# Patient Record
Sex: Male | Born: 1970 | Race: White | Hispanic: No | Marital: Married | Smoking: Former smoker
Health system: Southern US, Community
[De-identification: ages and names within clinical notes are randomized; demographics above are authoritative.]

## PROBLEM LIST (undated history)

## (undated) DIAGNOSIS — I10 Essential (primary) hypertension: Secondary | ICD-10-CM

## (undated) DIAGNOSIS — E669 Obesity, unspecified: Secondary | ICD-10-CM

## (undated) DIAGNOSIS — E559 Vitamin D deficiency, unspecified: Secondary | ICD-10-CM

## (undated) DIAGNOSIS — M109 Gout, unspecified: Secondary | ICD-10-CM

## (undated) HISTORY — DX: Vitamin D deficiency, unspecified: E55.9

## (undated) HISTORY — DX: Obesity, unspecified: E66.9

## (undated) HISTORY — DX: Gout, unspecified: M10.9

## (undated) HISTORY — DX: Essential (primary) hypertension: I10

---

## 2006-08-06 DIAGNOSIS — M109 Gout, unspecified: Secondary | ICD-10-CM

## 2006-08-06 HISTORY — DX: Gout, unspecified: M10.9

## 2009-08-06 DIAGNOSIS — E559 Vitamin D deficiency, unspecified: Secondary | ICD-10-CM

## 2009-08-06 HISTORY — DX: Vitamin D deficiency, unspecified: E55.9

## 2011-11-01 LAB — COMPREHENSIVE METABOLIC PANEL
ALT: 74 U/L — AB (ref 10–40)
AST: 38 U/L

## 2012-08-12 ENCOUNTER — Ambulatory Visit (INDEPENDENT_AMBULATORY_CARE_PROVIDER_SITE_OTHER): Payer: Managed Care, Other (non HMO) | Admitting: Physician Assistant

## 2012-08-12 VITALS — BP 144/92 | HR 116 | Temp 99.0°F | Resp 17 | Ht 70.0 in | Wt 232.0 lb

## 2012-08-12 DIAGNOSIS — J329 Chronic sinusitis, unspecified: Secondary | ICD-10-CM

## 2012-08-12 DIAGNOSIS — R05 Cough: Secondary | ICD-10-CM

## 2012-08-12 MED ORDER — AMOXICILLIN 875 MG PO TABS: 1750.0000 mg | ORAL_TABLET | Freq: Two times a day (BID) | ORAL | Status: DC

## 2012-08-12 MED ORDER — HYDROCOD POLST-CHLORPHEN POLST 10-8 MG/5ML PO LQCR: 5.0000 mL | Freq: Every evening | ORAL | Status: DC | PRN

## 2012-08-12 MED ORDER — GUAIFENESIN ER 1200 MG PO TB12: 1.0000 | ORAL_TABLET | Freq: Two times a day (BID) | ORAL | Status: DC

## 2012-08-12 NOTE — Progress Notes (Signed)
   [address], [place] [address]   Phone [phone]  Subjective:    Patient ID: Roger Powell, male    DOB: 12/17/1970, 42 y.o.   MRN: [id number]  HPI  Pt presents to clinic with 3 wk h/o cold symptoms that have now turned into sinus pressure, mild teeth pain.  Thick green mucus.  He has been using OTC meds since before Christmas but just does not feel like he is going to get better.  PT has gas heat in his house and feels dry.  Review of Systems  Constitutional: Negative for fever and chills.  HENT: Positive for congestion, sore throat (irriated), rhinorrhea (thick green), postnasal drip and sinus pressure (max the worse but hurts in frontal also).   Respiratory: Positive for cough (worse at night - like a tickle in the throat). Negative for shortness of breath and wheezing.   Neurological: Positive for headaches. Negative for dizziness.       Objective:   Physical Exam  Vitals reviewed. Constitutional: He is oriented to person, place, and time. He appears well-developed and well-nourished.  HENT:  Head: Normocephalic and atraumatic.  Right Ear: Hearing, tympanic membrane, external ear and ear canal normal.  Left Ear: Hearing, tympanic membrane, external ear and ear canal normal.  Nose: Mucosal edema (red) present.  Mouth/Throat: Uvula is midline and oropharynx is clear and moist.  Eyes: Conjunctivae normal are normal. Pupils are equal, round, and reactive to light.  Neck: Neck supple.  Cardiovascular: Normal rate, regular rhythm and normal heart sounds.   Pulmonary/Chest: Effort normal and breath sounds normal.  Lymphadenopathy:    He has no cervical adenopathy.  Neurological: He is alert and oriented to person, place, and time.  Skin: Skin is warm and dry.  Psychiatric: He has a normal mood and affect. His behavior is normal. Judgment and thought content normal.       Assessment & Plan:   1. Sinusitis  Guaifenesin (MUCINEX MAXIMUM STRENGTH) 1200 MG TB12,  amoxicillin (AMOXIL) 875 MG tablet, DISCONTINUED: amoxicillin (AMOXIL) 875 MG tablet  2. Cough  chlorpheniramine-HYDROcodone (TUSSIONEX PENNKINETIC ER) 10-8 MG/5ML LQCR   Due to gas heat pt to use cool mist humidifier.  He was given a refill because he has problems in the past with 5d antibiotic not helping but after talking it sounds like it might have been a zpack.  Pt will increase his fluid intake with help with thick mucus.

## 2012-10-03 ENCOUNTER — Encounter: Payer: Self-pay | Admitting: Family Medicine

## 2012-10-03 ENCOUNTER — Ambulatory Visit (INDEPENDENT_AMBULATORY_CARE_PROVIDER_SITE_OTHER): Payer: Managed Care, Other (non HMO) | Admitting: Family Medicine

## 2012-10-03 VITALS — BP 124/82 | HR 72 | Temp 98.4°F | Ht 71.0 in | Wt 215.2 lb

## 2012-10-03 DIAGNOSIS — I10 Essential (primary) hypertension: Secondary | ICD-10-CM

## 2012-10-03 DIAGNOSIS — M25552 Pain in left hip: Secondary | ICD-10-CM

## 2012-10-03 DIAGNOSIS — K429 Umbilical hernia without obstruction or gangrene: Secondary | ICD-10-CM

## 2012-10-03 DIAGNOSIS — E669 Obesity, unspecified: Secondary | ICD-10-CM

## 2012-10-03 DIAGNOSIS — Z3009 Encounter for other general counseling and advice on contraception: Secondary | ICD-10-CM

## 2012-10-03 DIAGNOSIS — E663 Overweight: Secondary | ICD-10-CM | POA: Insufficient documentation

## 2012-10-03 DIAGNOSIS — M25559 Pain in unspecified hip: Secondary | ICD-10-CM

## 2012-10-03 MED ORDER — ENALAPRIL MALEATE 20 MG PO TABS: 20.0000 mg | ORAL_TABLET | Freq: Every day | ORAL | Status: DC

## 2012-10-03 NOTE — Patient Instructions (Addendum)
Pass by Roger Powell's office to schedule urology referral We will watch umbilical hernia for now. Good job with blood pressure.  Continue enalapril for now. Good job with weight and exercise! Return as needed or for physical in next year, fasting prior for blood work Try stretching exercises for left hip.

## 2012-10-03 NOTE — Assessment & Plan Note (Signed)
Following at bariatric clinic. Congratulated on weight loss to date.

## 2012-10-03 NOTE — Progress Notes (Signed)
Subjective:    Patient ID: Roger Powell, male    DOB: 1970/11/12, 42 y.o.   MRN: [id number]  HPI CC: new pt to establish  Prior saw [facility] in [place]  HTN - compliant with lisinopril 20mg  daily.    Obesity - started seeing bariatric clinic last month.  Has lost 20 lbs in last 2 months.  Running 3 mi/day.  Has noticed pain in hip starts hurting after 30 min mark, improves with 2 days rest.  Lateral hip pain.  Improves with ibuprofen.  Worsens with movement. Wt Readings from Last 3 Encounters:  10/03/12 215 lb 4 oz (97.637 kg)  08/12/12 232 lb (105.235 kg)  Body mass index is 30.03 kg/(m^2).   Hernia - umbilical.  Has been told could wait.  Started working out again - limiting himself to [employer]. Would like vasectomy.  3 children.  Roger Powell is 45 yo daughter.  Currently on pill.  Preventative: No recent CPE Tetanus - unsure but thinks within last 10 years  Lives with wife and 3yo daughter and 2 sons (64 and 46), no pets Occupation: Psychologist, occupational Edu: some college Activity: runs 4mi/day, weight lifting Diet: good water, fruits/vegetables daily, good fish  Medications and allergies reviewed and updated in chart.  Past histories reviewed and updated if relevant as below. Patient Active Problem List  Diagnosis  . HTN (hypertension)  . Umbilical hernia  . Hypertension  . Obesity   Past Medical History  Diagnosis Date  . Hypertension   . Obesity    History reviewed. No pertinent past surgical history. History  Substance Use Topics  . Smoking status: Former Smoker    Quit date: 05/25/2009  . Smokeless tobacco: Never Used  . Alcohol Use: 3 - 4 oz/week    6-8 drink(s) per week   Family History  Problem Relation Age of Onset  . Diabetes Father   . Diabetes Paternal Grandmother   . Diabetes Paternal Grandfather   . Diabetes Paternal Uncle   . CAD Father 13    MI, stent x2  . CAD Paternal Grandfather   . Hypertension Neg Hx   . Stroke Neg Hx   . Cancer  Paternal Grandfather     pancreatic   Allergies  Allergen Reactions  . Levaquin (Levofloxacin In D5w) Rash  . Penicillins    No current outpatient prescriptions on file prior to visit.   No current facility-administered medications on file prior to visit.     Review of Systems  Constitutional: Positive for appetite change. Negative for fever, chills, activity change, fatigue and unexpected weight change (losing weight from trying).  HENT: Negative for hearing loss and neck pain.   Eyes: Negative for visual disturbance.  Respiratory: Negative for cough, chest tightness, shortness of breath and wheezing.   Cardiovascular: Negative for chest pain, palpitations and leg swelling.  Gastrointestinal: Positive for blood in stool (with wiping, attributes to constipation from phentermine.  + hemorrhoids.  improved with medicated pads. started stool softeners). Negative for nausea, vomiting, abdominal pain, diarrhea, constipation and abdominal distention.  Genitourinary: Negative for hematuria and difficulty urinating.  Musculoskeletal: Negative for myalgias and arthralgias.  Skin: Negative for rash.  Neurological: Negative for dizziness, seizures, syncope and headaches.  Hematological: Does not bruise/bleed easily.  Psychiatric/Behavioral: Negative for dysphoric mood. The patient is not nervous/anxious.        Objective:   Physical Exam  Nursing note and vitals reviewed. Constitutional: He is oriented to person, place, and time. He appears  well-developed and well-nourished. No distress.  HENT:  Head: Normocephalic and atraumatic.  Right Ear: Hearing, tympanic membrane, external ear and ear canal normal.  Left Ear: Hearing, tympanic membrane, external ear and ear canal normal.  Nose: Nose normal.  Mouth/Throat: Oropharynx is clear and moist. No oropharyngeal exudate.  Cerumen bilaterally  Eyes: Conjunctivae and EOM are normal. Pupils are equal, round, and reactive to light. No scleral  icterus.  Neck: Normal range of motion. Neck supple. No thyromegaly present.  Cardiovascular: Normal rate, regular rhythm, normal heart sounds and intact distal pulses.   No murmur heard. Pulses:      Radial pulses are 2+ on the right side, and 2+ on the left side.  Pulmonary/Chest: Effort normal and breath sounds normal. No respiratory distress. He has no wheezes. He has no rales.  Abdominal: Soft. Normal appearance and bowel sounds are normal. He exhibits no distension and no mass. There is no hepatosplenomegaly. There is no tenderness. There is no rebound, no guarding and no CVA tenderness. A hernia (umbilical, about 4cm muscle difect) is present. Hernia confirmed negative in the ventral area.  Musculoskeletal: Normal range of motion. He exhibits no edema.  No GTB tenderness bilaterally. Tenderness endorsed left lateral hip superior to bursa on hip bone but not currently.  Lymphadenopathy:    He has no cervical adenopathy.  Neurological: He is alert and oriented to person, place, and time.  CN grossly intact, station and gait intact  Skin: Skin is warm and dry. No rash noted.  Psychiatric: He has a normal mood and affect. His behavior is normal. Judgment and thought content normal.       Assessment & Plan:  Refer to urology for vasectomy evaluation.

## 2012-10-03 NOTE — Assessment & Plan Note (Signed)
Good sized defect, tender to palpation at umbilical hernia. Desires to monitor for now.  Knows red flags to seek urgent care.

## 2012-10-03 NOTE — Assessment & Plan Note (Signed)
Chronic, stable on enalapril 20mg  daily.  Refilled today. I asked him to get latest blood work for our chart.

## 2012-10-03 NOTE — Assessment & Plan Note (Signed)
Not consistent with greater trochanteric bursitis. Anticipate tendonitis of hip flexors - treat with stretching/strengthening exercises, ice, and NSAIDs.

## 2012-10-19 ENCOUNTER — Encounter: Payer: Self-pay | Admitting: Family Medicine

## 2012-10-19 DIAGNOSIS — E559 Vitamin D deficiency, unspecified: Secondary | ICD-10-CM | POA: Insufficient documentation

## 2012-10-28 ENCOUNTER — Encounter: Payer: Self-pay | Admitting: Family Medicine

## 2012-11-12 ENCOUNTER — Other Ambulatory Visit: Payer: Self-pay | Admitting: Physician Assistant

## 2013-03-05 LAB — LIPID PANEL
Cholesterol: 158 mg/dL (ref 0–200)
HDL: 71 mg/dL — AB (ref 35–70)

## 2013-03-05 LAB — GLUCOSE, FASTING: Glucose: 72

## 2013-03-10 ENCOUNTER — Encounter: Payer: Self-pay | Admitting: Family Medicine

## 2013-03-10 ENCOUNTER — Ambulatory Visit (INDEPENDENT_AMBULATORY_CARE_PROVIDER_SITE_OTHER): Payer: Managed Care, Other (non HMO) | Admitting: Family Medicine

## 2013-03-10 VITALS — BP 128/84 | HR 88 | Temp 98.4°F | Wt 193.8 lb

## 2013-03-10 DIAGNOSIS — M25562 Pain in left knee: Secondary | ICD-10-CM | POA: Insufficient documentation

## 2013-03-10 DIAGNOSIS — M25569 Pain in unspecified knee: Secondary | ICD-10-CM

## 2013-03-10 DIAGNOSIS — Z23 Encounter for immunization: Secondary | ICD-10-CM

## 2013-03-10 DIAGNOSIS — M109 Gout, unspecified: Secondary | ICD-10-CM

## 2013-03-10 MED ORDER — NAPROXEN 500 MG PO TABS: ORAL_TABLET | ORAL | Status: DC

## 2013-03-10 NOTE — Patient Instructions (Addendum)
You have pes anserine bursitis. Treat with prescription strength anti inflammatory and ice, rest and elevation to knee. Do stretching exercises daily. Let me know if not better with this. Tdap today.   Check with insurance if it will cover Hep A/B combo series for upcoming travel.

## 2013-03-10 NOTE — Progress Notes (Signed)
  Subjective:    Patient ID: Roger Powell, male    DOB: 1970-12-06, 42 y.o.   MRN: [id number]  HPI CC: I think I'm having a gout flare  Friday when left gym, felt L knee stiffness/tightness.  R elbow staying sore.  Swollen over weekend.  Mild heat and erythema noted as well.  Slowly improving on its own.  H/o gout flare in the past - usually R elbow, occasionally L knee.  Has been diagnosed with gout for last 7-8 years.  Has had podagra x 1.  So far has tried elevation of leg which helps some.  Also using ibuprofen 400mg  bid.  Not more red meat than normal, no shrimp.  Drinks alcohol - about 2 shots/day along with 3-4 beers. Salmon 3x/wk, tilapia 1-2x/wk.  Good protein shakes.  Denies falls/trauma.  Started running more frequently 1 wk prior to injury.  No fevers/chills, nausea. Has continued exercising at gym - elliptical and treadmill.  Using elliptical doesn't help knee.  Running hurts knee. Diet: good water, fruits/vegetables daily, good fish. Lab Results  Component Value Date   CREATININE 1.20 11/01/2011   Planning trip to [place] - asks about immunizations.  Past Medical History  Diagnosis Date  . Hypertension   . Obesity   . Vitamin D deficiency 2011  . Gout      Review of Systems Per HPI    Objective:   Physical Exam  Nursing note and vitals reviewed. Constitutional: He appears well-developed and well-nourished. No distress.  Musculoskeletal: He exhibits no edema.  FROM at knees. Tender to palpation, some edema noted, at L pes anserine bursa. No pain with McMurray's test, neg drawer test, no pain with valgus/varus testing of knees, no pain with PFgrind, no abnormal patellar mobility  Bilateral elbows with FROM flex/extension  Skin: Skin is warm and dry.       Assessment & Plan:  Tdap today. Pt will check with insurance on HepA/B coverage for upcoming travel.

## 2013-03-10 NOTE — Assessment & Plan Note (Signed)
Anticipate pes anserine bursitis. Treat with naprosyn, rest, ice, elevation, and stretching exercises from [facility] pt advisor. Update if not improving with this treatment. Not consistent with gout today.

## 2013-03-10 NOTE — Addendum Note (Signed)
Addended by: Josph Macho A on: 03/10/2013 03:37 PM   Modules accepted: Orders

## 2013-04-02 ENCOUNTER — Encounter: Payer: Self-pay | Admitting: Family Medicine

## 2013-05-01 ENCOUNTER — Telehealth: Payer: Self-pay

## 2013-05-01 MED ORDER — ALPRAZOLAM 0.5 MG PO TBDP: 0.5000 mg | ORAL_TABLET | Freq: Once | ORAL | Status: DC | PRN

## 2013-05-01 NOTE — Telephone Encounter (Signed)
Prescription called to [facility] [address]  Patient notified.

## 2013-05-01 NOTE — Telephone Encounter (Signed)
Pt left v/m; pt has anxiety about flying and pt is to fly out on Mon 05/04/13 at 6:20 AM for vacation. Pt apologizes for calling late for rx for anxiety. Pt has taken med for anxiety before when flying but does not remember name of med. [facility]. Please advise.

## 2013-06-22 ENCOUNTER — Encounter: Payer: Self-pay | Admitting: Internal Medicine

## 2013-06-22 ENCOUNTER — Ambulatory Visit (INDEPENDENT_AMBULATORY_CARE_PROVIDER_SITE_OTHER): Payer: Managed Care, Other (non HMO) | Admitting: Internal Medicine

## 2013-06-22 VITALS — BP 134/94 | HR 90 | Temp 98.2°F | Ht 71.0 in | Wt 201.0 lb

## 2013-06-22 DIAGNOSIS — J019 Acute sinusitis, unspecified: Secondary | ICD-10-CM

## 2013-06-22 MED ORDER — CEFDINIR 300 MG PO CAPS: 300.0000 mg | ORAL_CAPSULE | Freq: Two times a day (BID) | ORAL | Status: DC

## 2013-06-22 NOTE — Progress Notes (Addendum)
HPI  Pt presents to the clinic today with c/o headache, fatigue, nasal congestion, runny nose, facial pressure under his eyes and right ear pain. This all started about 9 days ago. He is blowing thick brown mucous out his nose. He has tried tylenol sinus, nasacort, and vit supplements without much relief. He denies fever, but has had chills or body aches. He does have a history of allergies, recurrent sinus infections but denies asthma. He is a former smoker, quit 4-5 years ago. He has not had sick contacts.  Review of Systems    Past Medical History  Diagnosis Date  . Hypertension   . Obesity   . Vitamin D deficiency 2011  . Gout 2008    prior PCP diagnosed    Family History  Problem Relation Age of Onset  . Diabetes Father   . Diabetes Paternal Grandmother   . Diabetes Paternal Grandfather   . Diabetes Paternal Uncle   . CAD Father 16    MI, stent x2  . CAD Paternal Grandfather   . Hypertension Neg Hx   . Stroke Neg Hx   . Cancer Paternal Grandfather     pancreatic    History   Social History  . Marital Status: Married    Spouse Name: N/A    Number of Children: N/A  . Years of Education: N/A   Occupational History  . Not on file.   Social History Main Topics  . Smoking status: Former Smoker    Quit date: 05/25/2009  . Smokeless tobacco: Never Used  . Alcohol Use: 3 - 4 oz/week    6-8 drink(s) per week     Comment: Regular  . Drug Use: No  . Sexual Activity: Yes    Birth Control/ Protection: Pill   Other Topics Concern  . Not on file   Social History Narrative   Lives with wife and 3yo daughter and 2 sons (65 and 12), no pets   Occupation: Psychologist, occupational   Edu: some college   Activity: runs 22mi/day, weight lifting   Diet: good water, fruits/vegetables daily, good fish    Allergies  Allergen Reactions  . Levaquin [Levofloxacin In D5w] Rash  . Penicillins      Constitutional: Positive headache, fatigue. Denies fever or abrupt weight changes.  HEENT:   Positive eye pain, pressure behind the eyes, facial pain, nasal congestion and sore throat. Denies eye redness, ear pain, ringing in the ears, wax buildup, runny nose or bloody nose. Respiratory: Positive cough. Denies difficulty breathing or shortness of breath.  Cardiovascular: Denies chest pain, chest tightness, palpitations or swelling in the hands or feet.   No other specific complaints in a complete review of systems (except as listed in HPI above).  Objective:    BP 134/94  Pulse 90  Temp(Src) 98.2 F (36.8 C) (Oral)  Ht 5\' 11"  (1.803 m)  Wt 201 lb (91.173 kg)  BMI 28.05 kg/m2  SpO2 97% Wt Readings from Last 3 Encounters:  06/22/13 201 lb (91.173 kg)  03/10/13 193 lb 12 oz (87.884 kg)  10/03/12 215 lb 4 oz (97.637 kg)    General: Appears his stated age, well developed, well nourished in NAD. HEENT: Head: normal shape and size, maxillary tenderness noted; Eyes: sclera white, no icterus, conjunctiva pink, PERRLA and EOMs intact; Ears: Tm's gray and intact, normal light reflex; Nose: mucosa pink and moist, septum midline; Throat/Mouth: + PND. Teeth present, mucosa pink and moist, no exudate noted, no lesions or ulcerations noted.  Neck: Mild cervical lymphadenopathy. Neck supple, trachea midline. No massses, lumps or thyromegaly present.  Cardiovascular: Normal rate and rhythm. S1,S2 noted.  No murmur, rubs or gallops noted. No JVD or BLE edema. No carotid bruits noted. Pulmonary/Chest: Normal effort and positive vesicular breath sounds. No respiratory distress. No wheezes, rales or ronchi noted.      Assessment & Plan:   Acute bacterial sinusitis  Can use a Neti Pot which can be purchased from your local drug store. Continue nasacort, tylenol, fluids, rest Omnicef BID for 10 days  RTC as needed or if symptoms persist.

## 2013-06-22 NOTE — Patient Instructions (Signed)

## 2013-06-25 ENCOUNTER — Telehealth: Payer: Self-pay

## 2013-06-25 NOTE — Telephone Encounter (Signed)
Pt was seen 06/22/13 and started omnicef noon on 06/22/13; pt taking tylenol sinus and using nasacort. Pt said last night S/T worsened, and has earache in both ears,achy all over body and prod cough on and off with green phlegm. No wheezing, no CP and no fever. Pt feels like he cannot get a full breath. No available appts today and pt cannot come for appt on 06/23/13.Please advise. [facility] pt request cb.

## 2013-06-25 NOTE — Telephone Encounter (Signed)
He needs to be seen here to reevaluate. If he can't come here then he needs to go to [facility]

## 2013-06-25 NOTE — Telephone Encounter (Signed)
Pt advise he need to be reevaluated pt said he will be in [facility] tomorrow, I advise pt they have UC in [place] too so he may want to go to one

## 2013-09-03 ENCOUNTER — Ambulatory Visit (INDEPENDENT_AMBULATORY_CARE_PROVIDER_SITE_OTHER)
Admission: RE | Admit: 2013-09-03 | Discharge: 2013-09-03 | Disposition: A | Discharge status: Home / Self Care | Payer: Managed Care, Other (non HMO) | Source: Ambulatory Visit | Attending: Family Medicine | Admitting: Family Medicine

## 2013-09-03 ENCOUNTER — Ambulatory Visit (INDEPENDENT_AMBULATORY_CARE_PROVIDER_SITE_OTHER): Payer: Managed Care, Other (non HMO) | Admitting: Family Medicine

## 2013-09-03 ENCOUNTER — Encounter: Payer: Self-pay | Admitting: Family Medicine

## 2013-09-03 VITALS — BP 128/76 | HR 78 | Temp 98.1°F | Wt 203.0 lb

## 2013-09-03 DIAGNOSIS — M25519 Pain in unspecified shoulder: Secondary | ICD-10-CM

## 2013-09-03 DIAGNOSIS — M25512 Pain in left shoulder: Secondary | ICD-10-CM

## 2013-09-03 MED ORDER — NAPROXEN 500 MG PO TABS: 500.0000 mg | ORAL_TABLET | Freq: Two times a day (BID) | ORAL | Status: DC

## 2013-09-03 NOTE — Assessment & Plan Note (Signed)
Anticipate combination of AC joint injury, subacromial bursitis, and impingement syndrome. Check xray today - overall normal on my read Discussed NSAIDs vs steroid injection to treat bursitis.  Pt would like injection - provided and pt tolerated well. Provided with stretching exercises for bursitis from [facility] pt advisor.

## 2013-09-03 NOTE — Progress Notes (Signed)
Pre-visit discussion using our clinic review tool. No additional management support is needed unless otherwise documented below in the visit note.  

## 2013-09-03 NOTE — Progress Notes (Signed)
   Subjective:    Patient ID: Roger Powell, male    DOB: Jul 03, 1971, 43 y.o.   MRN: [id number]  HPI CC: L shoulder pain  DOI: 10-15 yrs ago injured L shoulder playing wally ball (volley ball on raquetball court).   On and off pain since then.   Over the last year noticing worsening pain - started after he increased workout regimen with weight lifting.  Worse pain with bench press, shoulder press, flies, or dips.   Points to anterior and superior R shoulder just inferior to lateral acromion. Sore tenderness stays with him, and becomes localized stabbing pain with bench press. Worse pain at night time.  No recent injury to shoulder.  No shooting pain down left arm. Denies neck pain, numbness of arm. Has been taking ibuprofen for pain.  Right handed.  Past Medical History  Diagnosis Date  . Hypertension   . Obesity   . Vitamin D deficiency 2011  . Gout 2008    prior PCP diagnosed   History reviewed. No pertinent past surgical history.  Review of Systems Per HPI    Objective:   Physical Exam  Nursing note and vitals reviewed. Constitutional: He appears well-developed and well-nourished. No distress.  Musculoskeletal: He exhibits no edema.  No deformity noted. Tender to palpation at L [facility] joint and inferior to [facility] joint. No pain with testing SITS against resistance, neg empty can sign, neg yergason test, neg crossover test. + impingement. No pain with rotation of humeral head in [facility] joint. FROM at shoulders without significant pain or catching or weakness.       Assessment & Plan:  L shoulder steroid injection:  IC obtained and in chart.  Landmarks palpated and area cleaned with EtOH wipe.  Anesthesia achieved with ethyl chloride.  Using posterior lateral approach, steroid injection today - 1cc depo medrol, 4cc lidocaine using 22g 1.5in needle.  Pt tolerated well.  Post -procedure instructions provided

## 2013-09-03 NOTE — Patient Instructions (Signed)
Let's get an xray of left shoulder - overall looking ok. I think you have left shoulder bursitis and possible pinching of nerve Let's do steroid shot into left shoulder today and then start prescription anti inflammatory Take it easy on left shoulder for next week.

## 2013-09-17 ENCOUNTER — Encounter: Payer: Self-pay | Admitting: Family Medicine

## 2013-09-17 ENCOUNTER — Ambulatory Visit (INDEPENDENT_AMBULATORY_CARE_PROVIDER_SITE_OTHER): Payer: Managed Care, Other (non HMO) | Admitting: Family Medicine

## 2013-09-17 ENCOUNTER — Telehealth: Payer: Self-pay

## 2013-09-17 VITALS — BP 114/78 | HR 96 | Temp 98.1°F | Wt 199.5 lb

## 2013-09-17 DIAGNOSIS — M25562 Pain in left knee: Secondary | ICD-10-CM | POA: Insufficient documentation

## 2013-09-17 DIAGNOSIS — M25569 Pain in unspecified knee: Secondary | ICD-10-CM

## 2013-09-17 MED ORDER — TRAMADOL HCL 50 MG PO TABS: 50.0000 mg | ORAL_TABLET | Freq: Two times a day (BID) | ORAL | Status: DC | PRN

## 2013-09-17 NOTE — Assessment & Plan Note (Addendum)
Anticipate left prepatellar bursitis - treat with continued NSAIDs, tramadol, and ice/heat. Provided with handout on stretching exercises from [facility] advisor If not better, would refer to PT or have him see SM.  Pt agrees. Not consistent with septic bursitis.

## 2013-09-17 NOTE — Progress Notes (Signed)
   BP 114/78  Pulse 96  Temp(Src) 98.1 F (36.7 C) (Oral)  Wt 199 lb 8 oz (90.493 kg)   CC: L knee injury  Subjective:    Patient ID: Roger Powell, male    DOB: 09-Sep-1970, 43 y.o.   MRN: [id number]  HPI: Roger Powell is a 43 y.o. male presenting on 09/17/2013 with Knee Pain  Strained L knee while exercising (leg presses) 09/12/2013.  Worse in evenings when elevating leg.  Heating pad actually worsened pain.  Ice helped.  Continued working out and running.  Then noticed some swelling redness and warmth just below kneecap.  Worse pain with full extension, with prolonged standing, or laying supine.  Also worse with going up stairs.  No locking or instability  Denies other falls/injury. Does not sit crosslegged  Has been taking previously prescribed naprosyn bid regularly which hasn't really helped. Recent shoulder injection for bursitis, didn't really help. Dx with prepatellar bursitis 03/2013.  Relevant past medical, surgical, family and social history reviewed and updated. Allergies and medications reviewed and updated. Current Outpatient Prescriptions on File Prior to Visit  Medication Sig  . enalapril (VASOTEC) 20 MG tablet Take 1 tablet (20 mg total) by mouth daily.  . naproxen (NAPROSYN) 500 MG tablet Take 1 tablet (500 mg total) by mouth 2 (two) times daily with a meal. For 1 wk then as needed  . phentermine 37.5 MG capsule Take 37.5 mg by mouth every morning. Per bariatric clinic  . ALPRAZolam (NIRAVAM) 0.5 MG dissolvable tablet Take 1-2 tablets (0.5-1 mg total) by mouth once as needed for anxiety (prior to plane flight both ways).  Marland Kitchen. ibuprofen (ADVIL,MOTRIN) 200 MG tablet Take 200 mg by mouth as needed.   No current facility-administered medications on file prior to visit.    Review of Systems Per HPI unless specifically indicated above    Objective:    BP 114/78  Pulse 96  Temp(Src) 98.1 F (36.7 C) (Oral)  Wt 199 lb 8 oz (90.493 kg)  Physical Exam  Nursing note  and vitals reviewed. Constitutional: He appears well-developed and well-nourished. No distress.  Musculoskeletal: He exhibits no edema.  R knee WNL L knee - no deformity or erythema or warmth of knee, no popliteal fullness, some crepitus with full extension, tender to palpation medial to patellar tendon Neg drawer test, neg mcmurrays, no pain with valgus/varus testing, no PF grind, no abnormal patellar mobility.       Assessment & Plan:   Problem List Items Addressed This Visit   Left knee pain - Primary     Anticipate left prepatellar bursitis - treat with continued NSAIDs, tramadol, and ice/heat. Provided with handout on stretching exercises from [facility] pt advisor If not better, would refer to PT or have him see SM.  Pt agrees.        Follow up plan: Return if symptoms worsen or fail to improve.

## 2013-09-17 NOTE — Patient Instructions (Signed)
I think you have prepatellar bursitis.   Continue naprosyn twice daily, start tramadol for pain at night time as needed. Continue icing. Stretching exercises provided today as well. If not improving with that, call to schedule appointment with Dr. Patsy Lageropland our sports medicine doctor.

## 2013-09-17 NOTE — Progress Notes (Signed)
Pre-visit discussion using our clinic review tool. No additional management support is needed unless otherwise documented below in the visit note.  

## 2013-09-17 NOTE — Telephone Encounter (Signed)
Pt left v/m pt  strained his knee on 09/13/13 while lifting weights, pt has kept ice on knee with no relief, heat made knee hurt worse; pt does not think inflammation. Only slept 2 hours last night due to pain. Pt tried to get appt today but no appts available and pt request med for pain until can get appt to be seen. While speaking with pt appt was available today at 5:15. Pt will see Dr Reece AgarG this afternoon.

## 2013-09-17 NOTE — Telephone Encounter (Signed)
Will see then. 

## 2013-11-27 ENCOUNTER — Ambulatory Visit (INDEPENDENT_AMBULATORY_CARE_PROVIDER_SITE_OTHER): Payer: Managed Care, Other (non HMO) | Admitting: Internal Medicine

## 2013-11-27 ENCOUNTER — Encounter: Payer: Self-pay | Admitting: Internal Medicine

## 2013-11-27 VITALS — BP 118/74 | HR 100 | Temp 98.3°F | Wt 208.0 lb

## 2013-11-27 DIAGNOSIS — J309 Allergic rhinitis, unspecified: Secondary | ICD-10-CM

## 2013-11-27 MED ORDER — PREDNISONE 10 MG PO TABS: ORAL_TABLET | ORAL | Status: DC

## 2013-11-27 NOTE — Progress Notes (Signed)
HPI  Pt presents to the clinic today with c/o runny nose, itchy, watery eyes, cough and sore throat. He reports this started 1 week ago. He has had some sinus pressure and blowing green mucous out of his nose. He denies fever, chills or body aches. He has tried zyrtec, mucinex, flonase. He has a history of allergies. He has not had sick contacts. He does not smoke.  Review of Systems    Past Medical History  Diagnosis Date  . Hypertension   . Obesity   . Vitamin D deficiency 2011  . Gout 2008    prior PCP diagnosed    Family History  Problem Relation Age of Onset  . Diabetes Father   . Diabetes Paternal Grandmother   . Diabetes Paternal Grandfather   . Diabetes Paternal Uncle   . CAD Father 4151    MI, stent x2  . CAD Paternal Grandfather   . Hypertension Neg Hx   . Stroke Neg Hx   . Cancer Paternal Grandfather     pancreatic    History   Social History  . Marital Status: Married    Spouse Name: N/A    Number of Children: N/A  . Years of Education: N/A   Occupational History  . Not on file.   Social History Main Topics  . Smoking status: Former Smoker    Quit date: 05/25/2009  . Smokeless tobacco: Never Used  . Alcohol Use: 3.0 - 4.0 oz/week    6-8 drink(s) per week     Comment: Regular  . Drug Use: No  . Sexual Activity: Yes    Birth Control/ Protection: Pill   Other Topics Concern  . Not on file   Social History Narrative   Lives with wife and 3yo daughter and 2 sons ([phone] and [phone]), no pets   Occupation: Psychologist, occupationalbanker   Edu: some college   Activity: runs 373mi/day, weight lifting   Diet: good water, fruits/vegetables daily, good fish    Allergies  Allergen Reactions  . Levaquin [Levofloxacin In D5w] Rash  . Penicillins      Constitutional: Positive headache, fatigue. Denies fever or abrupt weight changes.  HEENT:  Positive runny nose and sore throat. Denies eye redness, ear pain, ringing in the ears, wax buildup, or bloody nose. Respiratory: Positive  cough. Denies difficulty breathing or shortness of breath.  Cardiovascular: Denies chest pain, chest tightness, palpitations or swelling in the hands or feet.   No other specific complaints in a complete review of systems (except as listed in HPI above).  Objective:    General: Appears his stated age, well developed, well nourished in NAD. HEENT: Head: normal shape and size, no sinus tenderness noted; Eyes: sclera white, no icterus, conjunctiva pink, PERRLA and EOMs intact; Ears: Tm's gray and intact, normal light reflex; Nose: mucosa boggy and moist, septum midline; Throat/Mouth: + PND. Teeth present, mucosa pink and moist, no exudate noted, no lesions or ulcerations noted.  Neck: Neck supple, trachea midline. No massses, lumps or thyromegaly present.  Cardiovascular: Normal rate and rhythm. S1,S2 noted.  No murmur, rubs or gallops noted. No JVD or BLE edema. No carotid bruits noted. Pulmonary/Chest: Normal effort and positive vesicular breath sounds. No respiratory distress. No wheezes, rales or ronchi noted.      Assessment & Plan:   Allergic Rhinitis  Can use a Neti Pot which can be purchased from your local drug store. Continue zyrtec and flonase eRx for pred taper If no improvement by Monday, call  back, will call in ceftin 500 mg BID x 10 days  RTC as needed or if symptoms persist.

## 2013-11-27 NOTE — Patient Instructions (Addendum)
Allergic Rhinitis Allergic rhinitis is when the mucous membranes in the nose respond to allergens. Allergens are particles in the air that cause your body to have an allergic reaction. This causes you to release allergic antibodies. Through a chain of events, these eventually cause you to release histamine into the blood stream. Although meant to protect the body, it is this release of histamine that causes your discomfort, such as frequent sneezing, congestion, and an itchy, runny nose.  CAUSES  Seasonal allergic rhinitis (hay fever) is caused by pollen allergens that may come from grasses, trees, and weeds. Year-round allergic rhinitis (perennial allergic rhinitis) is caused by allergens such as house dust mites, pet dander, and mold spores.  SYMPTOMS   Nasal stuffiness (congestion).  Itchy, runny nose with sneezing and tearing of the eyes. DIAGNOSIS  Your health care provider can help you determine the allergen or allergens that trigger your symptoms. If you and your health care provider are unable to determine the allergen, skin or blood testing may be used. TREATMENT  Allergic Rhinitis does not have a cure, but it can be controlled by:  Medicines and allergy shots (immunotherapy).  Avoiding the allergen. Hay fever may often be treated with antihistamines in pill or nasal spray forms. Antihistamines block the effects of histamine. There are over-the-counter medicines that may help with nasal congestion and swelling around the eyes. Check with your health care provider before taking or giving this medicine.  If avoiding the allergen or the medicine prescribed do not work, there are many new medicines your health care provider can prescribe. Stronger medicine may be used if initial measures are ineffective. Desensitizing injections can be used if medicine and avoidance does not work. Desensitization is when a patient is given ongoing shots until the body becomes less sensitive to the allergen.  Make sure you follow up with your health care provider if problems continue. HOME CARE INSTRUCTIONS It is not possible to completely avoid allergens, but you can reduce your symptoms by taking steps to limit your exposure to them. It helps to know exactly what you are allergic to so that you can avoid your specific triggers. SEEK MEDICAL CARE IF:   You have a fever.  You develop a cough that does not stop easily (persistent).  You have shortness of breath.  You start wheezing.  Symptoms interfere with normal daily activities. Document Released: 04/17/2001 Document Revised: 05/13/2013 Document Reviewed: 03/30/2013 [employer] Patient Information 2014 [employer].  

## 2013-11-27 NOTE — Progress Notes (Signed)
Pre visit review using our clinic review tool, if applicable. No additional management support is needed unless otherwise documented below in the visit note. 

## 2014-02-04 ENCOUNTER — Telehealth: Payer: Self-pay | Admitting: Family Medicine

## 2014-02-04 ENCOUNTER — Ambulatory Visit (INDEPENDENT_AMBULATORY_CARE_PROVIDER_SITE_OTHER): Payer: Managed Care, Other (non HMO) | Admitting: Family Medicine

## 2014-02-04 ENCOUNTER — Ambulatory Visit (INDEPENDENT_AMBULATORY_CARE_PROVIDER_SITE_OTHER)
Admission: RE | Admit: 2014-02-04 | Discharge: 2014-02-04 | Disposition: A | Discharge status: Home / Self Care | Payer: Managed Care, Other (non HMO) | Source: Ambulatory Visit | Attending: Family Medicine | Admitting: Family Medicine

## 2014-02-04 ENCOUNTER — Encounter: Payer: Self-pay | Admitting: Family Medicine

## 2014-02-04 VITALS — BP 122/70 | HR 79 | Temp 98.1°F | Ht 71.0 in | Wt 205.5 lb

## 2014-02-04 DIAGNOSIS — G2581 Restless legs syndrome: Secondary | ICD-10-CM | POA: Insufficient documentation

## 2014-02-04 DIAGNOSIS — M79609 Pain in unspecified limb: Secondary | ICD-10-CM

## 2014-02-04 DIAGNOSIS — M79605 Pain in left leg: Principal | ICD-10-CM

## 2014-02-04 DIAGNOSIS — E559 Vitamin D deficiency, unspecified: Secondary | ICD-10-CM

## 2014-02-04 DIAGNOSIS — M79604 Pain in right leg: Secondary | ICD-10-CM | POA: Insufficient documentation

## 2014-02-04 MED ORDER — ROPINIROLE HCL 1 MG PO TABS: ORAL_TABLET | ORAL | Status: DC

## 2014-02-04 NOTE — Progress Notes (Signed)
Pre visit review using our clinic review tool, if applicable. No additional management support is needed unless otherwise documented below in the visit note. 

## 2014-02-04 NOTE — Progress Notes (Signed)
   Subjective:    Patient ID: Roger Powell, male    DOB: 1971-06-04, 43 y.o.   MRN: [id number]  HPI 43 year old male pt of Dr. Reece AgarG presents with chronic onset leg pain off and on over the last year, worse in last week. He describes it like dull achy leg fatigue more than pain. He notes it worse at rest, better with exercise. Worse in evenings. Keeping him up at night, only slept 3 hours last night. No creepy crawly feeling. He tries walking which helps some. He takes a supplement.  He has tried heat, advil,  Melatonin, valerian root, stretching without help. Muscle relaxant and pain med from shoulder surgery helps a little.  No numbness. Occ low back pain.   He runs 4-5 times a week.  No new med.    Father with restless leg.   Review of Systems  Constitutional: Negative for fever and fatigue.  HENT: Negative for ear pain.   Eyes: Negative for pain.  Respiratory: Negative for cough and shortness of breath.   Cardiovascular: Negative for chest pain.  Gastrointestinal: Negative for abdominal pain.       Objective:   Physical Exam  Constitutional: Vital signs are normal. He appears well-developed and well-nourished.  HENT:  Head: Normocephalic.  Right Ear: Hearing normal.  Left Ear: Hearing normal.  Nose: Nose normal.  Mouth/Throat: Oropharynx is clear and moist and mucous membranes are normal.  Neck: Trachea normal. Carotid bruit is not present. No mass and no thyromegaly present.  Cardiovascular: Normal rate, regular rhythm and normal pulses.  Exam reveals no gallop, no distant heart sounds and no friction rub.   No murmur heard. No peripheral edema  Pulmonary/Chest: Effort normal and breath sounds normal. No respiratory distress.  Skin: Skin is warm, dry and intact. No rash noted.  Psychiatric: He has a normal mood and affect. His speech is normal and behavior is normal. Thought content normal.          Assessment & Plan:   Encounter Diagnoses  Name Primary?    . Bilateral leg pain Yes  . ? Restless leg syndrome   . Vitamin D deficiency    Will evaluate with labs for secondary causes from vit def, thyroid, low iron etc.  Possible restless leg versus symptoms from lumbar spine . Eval with lumbar films. Will trial of ropinerole 1 mg daily,likely will need to titrate up.

## 2014-02-04 NOTE — Telephone Encounter (Signed)
Returned call to [name] and got his voicemail again.

## 2014-02-04 NOTE — Telephone Encounter (Signed)
Patient returned your call.

## 2014-02-04 NOTE — Telephone Encounter (Signed)
[facility] by Dr. Ermalene SearingBedsole.  See Result Note.

## 2014-02-09 LAB — CBC WITH DIFFERENTIAL/PLATELET
BASOS: 0 %
Basophils Absolute: 0 10*3/uL (ref 0.0–0.2)
Eos: 2 %
Eosinophils Absolute: 0.1 10*3/uL (ref 0.0–0.4)
HCT: 46.7 % (ref 37.5–51.0)
Hemoglobin: 15.8 g/dL (ref 12.6–17.7)
IMMATURE GRANS (ABS): 0 10*3/uL (ref 0.0–0.1)
Immature Granulocytes: 0 %
LYMPHS: 46 %
Lymphocytes Absolute: 3.7 10*3/uL — ABNORMAL HIGH (ref 0.7–3.1)
MCH: 30.7 pg (ref 26.6–33.0)
MCHC: 33.8 g/dL (ref 31.5–35.7)
MCV: 91 fL (ref 79–97)
Monocytes Absolute: 0.8 10*3/uL (ref 0.1–0.9)
Monocytes: 10 %
Neutrophils Absolute: 3.4 10*3/uL (ref 1.4–7.0)
Neutrophils Relative %: 42 %
RBC: 5.15 x10E6/uL (ref 4.14–5.80)
RDW: 13.2 % (ref 12.3–15.4)
WBC: 8.1 10*3/uL (ref 3.4–10.8)

## 2014-02-09 LAB — VITAMIN B12: Vitamin B-12: 819 pg/mL (ref 211–946)

## 2014-02-09 LAB — MAGNESIUM: MAGNESIUM: 2.5 mg/dL (ref 1.6–2.6)

## 2014-02-09 LAB — VITAMIN D 25 HYDROXY (VIT D DEFICIENCY, FRACTURES): Vit D, 25-Hydroxy: 28 ng/mL — ABNORMAL LOW (ref 30.0–100.0)

## 2014-02-09 LAB — FERRITIN: FERRITIN: 111 ng/mL (ref 30–400)

## 2014-02-09 LAB — TSH: TSH: 1.59 u[IU]/mL (ref 0.450–4.500)

## 2014-03-18 ENCOUNTER — Encounter: Payer: Self-pay | Admitting: Family Medicine

## 2014-03-18 ENCOUNTER — Ambulatory Visit (INDEPENDENT_AMBULATORY_CARE_PROVIDER_SITE_OTHER): Payer: Managed Care, Other (non HMO) | Admitting: Family Medicine

## 2014-03-18 VITALS — BP 118/70 | HR 73 | Temp 98.1°F | Ht 70.0 in | Wt 211.8 lb

## 2014-03-18 DIAGNOSIS — E669 Obesity, unspecified: Secondary | ICD-10-CM

## 2014-03-18 DIAGNOSIS — E639 Nutritional deficiency, unspecified: Secondary | ICD-10-CM | POA: Insufficient documentation

## 2014-03-18 DIAGNOSIS — F509 Eating disorder, unspecified: Secondary | ICD-10-CM

## 2014-03-18 MED ORDER — PHENTERMINE HCL 30 MG PO CAPS: 30.0000 mg | ORAL_CAPSULE | ORAL | Status: DC

## 2014-03-18 MED ORDER — TRAZODONE HCL 50 MG PO TABS: 25.0000 mg | ORAL_TABLET | Freq: Every evening | ORAL | Status: DC | PRN

## 2014-03-18 NOTE — Assessment & Plan Note (Signed)
Weight gain noted over last few months. Discussed healthy diet and activity levels. Will trial phentermine 30mg  daily. Discussed side effects - has tolerated well in past. Body mass index is 30.38 kg/(m^2).

## 2014-03-18 NOTE — Progress Notes (Signed)
Pre visit review using our clinic review tool, if applicable. No additional management support is needed unless otherwise documented below in the visit note. 

## 2014-03-18 NOTE — Assessment & Plan Note (Signed)
Night time eating - sabotaging effort placed during the day on healthy diet and activity choices. Discussed all this. Trouble with waking up at night time to eat - will try trazodone 25-50mg  at night time for sleep aid as well as phentermine for appetite suppression. rtc 1-2 mo f/u visit. Pt agrees with plan

## 2014-03-18 NOTE — Patient Instructions (Signed)
Let's try phentermine 30mg  daily for appetite, let's try trazodone 25-50 mg nightly for sleep. Let me know effect of this. Return in 2 months for follow up

## 2014-03-18 NOTE — Progress Notes (Signed)
BP 118/70  Pulse 73  Temp(Src) 98.1 F (36.7 C) (Oral)  Ht 5\' 10"  (1.778 m)  Wt 211 lb 12 oz (96.049 kg)  BMI 30.38 kg/m2  SpO2 98%   CC: discuss weight  Subjective:    Patient ID: Roger Powell Bothwell, male    DOB: [birth year], 43 y.o.   MRN: [id number]  HPI: Roger Powell Iafrate is a 43 y.o. male presenting on 03/18/2014 for Weight Loss   Has noticed weight gain over last several months. Worried about this. Had been doing well until shoulder surgery in March. Still exercising regularly but has trouble with diet - mainly at night time. Wakes up 3-5 times at night time - and prepares night time snack (cookies and honey buns). ?"sleep eating."  Tends to wake up at night time chronically.   Occasional trouble falling asleep, significant trouble staying asleep. Takes melatonin or sleep aide which haven't really helped. No snoring, no apnea or PNdyspnea. Wakes up feeling rested. No significant daytime sleepiness. No significant nocturia.   Prior phentermine use helped with weight loss. Denies chest pain or headache or insomnia as side effect when on this med.  Quit drinking EtOH in December - since then more trouble sleeping.   Activity - 2.5-5 miles jogging on treadmills 4x/wk (20-40 min), lifts weights 3-5 times a week.   24 hour recall: 8am - protein shake with banana, strawberry, coffee, protein powder and ice. 10:30am snack - almonds, beef jerky, water 12:30pm lunch - grilled chicken breast and lima beans, water 3pm snack - protein bar and apple, water 8pm dinner - several pieces of fish and green beans and watermelon, water 10pm snack - frozen yogurt Night time snack - honey bun, fruity pebble bar, cookie.  Wt Readings from Last 3 Encounters:  03/18/14 211 lb 12 oz (96.049 kg)  02/04/14 205 lb 8 oz (93.214 kg)  11/27/13 208 lb (94.348 kg)  Body mass index is 30.38 kg/(m^2).   Nadir weight was 185. Peak was 230lbs. Does not want to increase weight again.  Lives with wife and 3yo  daughter and 2 sons ([id number]), no pets Occupation: Psychologist, occupationalbanker Edu: some college Activity: runs 53mi/day, weight lifting Diet: good water, fruits/vegetables daily, good fish  Past Medical History  Diagnosis Date  . Hypertension   . Obesity   . Vitamin D deficiency 2011  . Gout 2008    prior PCP diagnosed   History reviewed. No pertinent past surgical history.  Relevant past medical, surgical, family and social history reviewed and updated as indicated.  Allergies and medications reviewed and updated. No current outpatient prescriptions on file prior to visit.   No current facility-administered medications on file prior to visit.    Review of Systems Per HPI unless specifically indicated above    Objective:    BP 118/70  Pulse 73  Temp(Src) 98.1 F (36.7 C) (Oral)  Ht 5\' 10"  (1.778 m)  Wt 211 lb 12 oz (96.049 kg)  BMI 30.38 kg/m2  SpO2 98%  Physical Exam  Nursing note and vitals reviewed. Constitutional: He appears well-developed and well-nourished. No distress.  Cardiovascular: Normal rate, regular rhythm, normal heart sounds and intact distal pulses.   No murmur heard. Pulmonary/Chest: Effort normal and breath sounds normal. No respiratory distress. He has no wheezes. He has no rales.  Musculoskeletal: He exhibits no edema.  Psychiatric: He has a normal mood and affect.   Results for orders placed in visit on 02/04/14  VITAMIN B12  Result Value Ref Range   Vitamin Powell-12 819  211 - 946 pg/mL  VITAMIN D 25 HYDROXY      Result Value Ref Range   Vit D, 25-Hydroxy 28.0 (*) 30.0 - 100.0 ng/mL  TSH      Result Value Ref Range   TSH 1.590  0.450 - 4.500 uIU/mL  MAGNESIUM      Result Value Ref Range   Magnesium 2.5  1.6 - 2.6 mg/dL  FERRITIN      Result Value Ref Range   Ferritin 111  30 - 400 ng/mL  CBC WITH DIFFERENTIAL      Result Value Ref Range   WBC 8.1  3.4 - 10.8 x10E3/uL   RBC 5.15  4.14 - 5.80 x10E6/uL   Hemoglobin 15.8  12.6 - 17.7 g/dL   HCT 16.1   09.6 - 04.5 %   MCV 91  79 - 97 fL   MCH 30.7  26.6 - 33.0 pg   MCHC 33.8  31.5 - 35.7 g/dL   RDW 40.9  81.1 - 91.4 %   Neutrophils Relative % 42     Lymphs 46     Monocytes 10     Eos 2     Basos 0     Neutrophils Absolute 3.4  1.4 - 7.0 x10E3/uL   Lymphocytes Absolute 3.7 (*) 0.7 - 3.1 x10E3/uL   Monocytes Absolute 0.8  0.1 - 0.9 x10E3/uL   Eosinophils Absolute 0.1  0.0 - 0.4 x10E3/uL   Basophils Absolute 0.0  0.0 - 0.2 x10E3/uL   Immature Granulocytes 0     Immature Grans (Abs) 0.0  0.0 - 0.1 x10E3/uL      Assessment & Plan:   Problem List Items Addressed This Visit   Obesity     Weight gain noted over last few months. Discussed healthy diet and activity levels. Will trial phentermine 30mg  daily. Discussed side effects - has tolerated well in past. Body mass index is 30.38 kg/(m^2).    Relevant Medications      phentermine capsule   Poor eating habits - Primary     Night time eating - sabotaging effort placed during the day on healthy diet and activity choices. Discussed all this. Trouble with waking up at night time to eat - will try trazodone 25-50mg  at night time for sleep aid as well as phentermine for appetite suppression. rtc 1-2 mo f/u visit. Pt agrees with plan        Follow up plan: Return in about 2 months (around 05/18/2014), or if symptoms worsen or fail to improve, for follow up visit.

## 2014-04-05 ENCOUNTER — Encounter: Payer: Self-pay | Admitting: Internal Medicine

## 2014-04-05 ENCOUNTER — Ambulatory Visit (INDEPENDENT_AMBULATORY_CARE_PROVIDER_SITE_OTHER): Payer: Managed Care, Other (non HMO) | Admitting: Internal Medicine

## 2014-04-05 VITALS — BP 138/80 | HR 97 | Temp 97.8°F | Wt 206.0 lb

## 2014-04-05 DIAGNOSIS — J301 Allergic rhinitis due to pollen: Secondary | ICD-10-CM | POA: Insufficient documentation

## 2014-04-05 MED ORDER — CEFUROXIME AXETIL 250 MG PO TABS: 250.0000 mg | ORAL_TABLET | Freq: Two times a day (BID) | ORAL | Status: DC

## 2014-04-05 NOTE — Assessment & Plan Note (Signed)
Not sure about his allergy history but was seen in April and now again Discussed beefing up his regimen Will give Rx for ceftin in case he worsens--seems to have sinusitis as well but not clear bacterial

## 2014-04-05 NOTE — Patient Instructions (Signed)
Please increase the loratadine to 2 tabs in the morning. Add cetirizine  in the evening. If you are not better in the next few days, or you get worse, start the cefuroxime antibiotic.

## 2014-04-05 NOTE — Progress Notes (Signed)
Pre visit review using our clinic review tool, if applicable. No additional management support is needed unless otherwise documented below in the visit note. 

## 2014-04-05 NOTE — Progress Notes (Signed)
   Subjective:    Patient ID: Roger Powell, male    DOB: 1971-07-13, 43 y.o.   MRN: [id number]  HPI Started with symptoms around 9 days Scratchy throat with irritated cough Maxillary pressure and in ears (today) Congestion in nose Bad headache 2 days ago  Using claritin and flonase for the past week Helps some---less drainage No remarkable drainage (PND)--though some green mucus this am  No fever He does get sense "that I get less oxygen" Gets DOE easier No wheezing  Current Outpatient Prescriptions on File Prior to Visit  Medication Sig Dispense Refill  . phentermine 30 MG capsule Take 1 capsule (30 mg total) by mouth every morning.  30 capsule  2  . traZODone (DESYREL) 50 MG tablet Take 0.5-1 tablets (25-50 mg total) by mouth at bedtime as needed for sleep.  30 tablet  3   No current facility-administered medications on file prior to visit.    Allergies  Allergen Reactions  . Levaquin [Levofloxacin In D5w] Rash  . Penicillins     Past Medical History  Diagnosis Date  . Hypertension   . Obesity   . Vitamin D deficiency 2011  . Gout 2008    prior PCP diagnosed    No past surgical history on file.  Family History  Problem Relation Age of Onset  . Diabetes Father   . Diabetes Paternal Grandmother   . Diabetes Paternal Grandfather   . Diabetes Paternal Uncle   . CAD Father 18    MI, stent x2  . CAD Paternal Grandfather   . Hypertension Neg Hx   . Stroke Neg Hx   . Cancer Paternal Grandfather     pancreatic    History   Social History  . Marital Status: Married    Spouse Name: N/A    Number of Children: N/A  . Years of Education: N/A   Occupational History  . Not on file.   Social History Main Topics  . Smoking status: Former Smoker    Quit date: 05/25/2009  . Smokeless tobacco: Never Used  . Alcohol Use: No  . Drug Use: No  . Sexual Activity: Yes    Birth Control/ Protection: Pill   Other Topics Concern  . Not on file   Social History  Narrative   Lives with wife and 3yo daughter and 2 sons (40 and 42), no pets   Occupation: Psychologist, occupational   Edu: some college   Activity: runs 58mi/day, weight lifting   Diet: good water, fruits/vegetables daily, good fish    Review of Systems No rash Some loose stools--more frequent No nausea and appetite is okay (though somewhat decreased)    Objective:   Physical Exam  Constitutional: He appears well-developed and well-nourished. No distress.  HENT:  Mouth/Throat: Oropharynx is clear and moist. No oropharyngeal exudate.  Mild maxillary tenderness TMs normal Moderate nasal inflammation  Neck: Normal range of motion. Neck supple. No thyromegaly present.  ?small non tender anterior cervical nodes  Pulmonary/Chest: Effort normal and breath sounds normal. No respiratory distress. He has no wheezes. He has no rales.          Assessment & Plan:

## 2014-05-14 ENCOUNTER — Other Ambulatory Visit: Payer: Self-pay | Admitting: Family Medicine

## 2014-05-14 NOTE — Telephone Encounter (Signed)
plz phone in. 

## 2014-05-14 NOTE — Telephone Encounter (Signed)
Ok to refill 

## 2014-05-14 NOTE — Telephone Encounter (Signed)
Rx called in as directed and follow up scheduled.

## 2014-06-03 ENCOUNTER — Ambulatory Visit (INDEPENDENT_AMBULATORY_CARE_PROVIDER_SITE_OTHER): Payer: Managed Care, Other (non HMO) | Admitting: Family Medicine

## 2014-06-03 ENCOUNTER — Encounter: Payer: Self-pay | Admitting: Family Medicine

## 2014-06-03 VITALS — BP 122/84 | HR 104 | Temp 98.2°F | Wt 206.2 lb

## 2014-06-03 DIAGNOSIS — E639 Nutritional deficiency, unspecified: Secondary | ICD-10-CM

## 2014-06-03 DIAGNOSIS — R000 Tachycardia, unspecified: Secondary | ICD-10-CM | POA: Insufficient documentation

## 2014-06-03 DIAGNOSIS — E669 Obesity, unspecified: Secondary | ICD-10-CM

## 2014-06-03 MED ORDER — TRAZODONE HCL 100 MG PO TABS: 100.0000 mg | ORAL_TABLET | Freq: Every evening | ORAL | Status: DC | PRN

## 2014-06-03 MED ORDER — PHENTERMINE HCL 37.5 MG PO TABS: 37.5000 mg | ORAL_TABLET | Freq: Every day | ORAL | Status: DC

## 2014-06-03 NOTE — Assessment & Plan Note (Signed)
Doing better with trazodone 100mg  nightly. No longer night time eating, sleeps night through, wakes up feeling rested. Continue this dose.

## 2014-06-03 NOTE — Progress Notes (Signed)
Pre visit review using our clinic review tool, if applicable. No additional management support is needed unless otherwise documented below in the visit note. 

## 2014-06-03 NOTE — Assessment & Plan Note (Signed)
Reviewed with patient, discussed how to check HR - pt has app on phone to do this. Anticipate due to supplement + phentermine  Pt will stop supplement and monitor HR at home - update me in 2wks with #s. Pt agrees with plan.

## 2014-06-03 NOTE — Patient Instructions (Addendum)
BMI has dropped below 30 - good job, keep up the good work. Continue phentermine and trazodone. I've sent in 37.5mg  tablets of phentermine you can cut in half. Return to see me in 3 months for follow up, sooner if needed. Stop supplement. Monitor heart rate over next few weeks and update me with readings. If persistently >90-100 we may need to decrease phentermine dose.
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
## 2014-08-17 ENCOUNTER — Ambulatory Visit (INDEPENDENT_AMBULATORY_CARE_PROVIDER_SITE_OTHER): Payer: PRIVATE HEALTH INSURANCE | Admitting: Family Medicine

## 2014-08-17 ENCOUNTER — Encounter: Payer: Self-pay | Admitting: Family Medicine

## 2014-08-17 VITALS — BP 130/82 | HR 106 | Temp 99.4°F | Ht 70.0 in | Wt 204.5 lb

## 2014-08-17 DIAGNOSIS — J0120 Acute ethmoidal sinusitis, unspecified: Secondary | ICD-10-CM

## 2014-08-17 DIAGNOSIS — J019 Acute sinusitis, unspecified: Secondary | ICD-10-CM | POA: Insufficient documentation

## 2014-08-17 DIAGNOSIS — J0121 Acute recurrent ethmoidal sinusitis: Secondary | ICD-10-CM | POA: Diagnosis not present

## 2014-08-17 MED ORDER — CEFDINIR 300 MG PO CAPS: 300.0000 mg | ORAL_CAPSULE | Freq: Two times a day (BID) | ORAL | Status: DC

## 2014-08-17 NOTE — Progress Notes (Signed)
Subjective:    Patient ID: Roger Powell, male    DOB: 1970/10/13, 44 y.o.   MRN: [id number]  HPI Here with uri symptoms  Started symptoms last week  ? Sinus infection   Not improving- getting worse after more than a week  Headache/facial pain - over L eye /and under it  ST- from coughing  Cough - little productive - ? If color to it  Congestion - with drainage  Non smoker   Temp is 99.4  Got up to 101 at home   Taking otc meds - tylenol sinus/ flonase or nasacort/allergy med ? /ibuprofen   Tends to get sinus infections   Patient Active Problem List   Diagnosis Date Noted  . Tachycardia 06/03/2014  . Allergic rhinitis due to pollen 04/05/2014  . Poor eating habits 03/18/2014  . Bilateral leg pain 02/04/2014  . ? Restless leg syndrome 02/04/2014  . Gout   . Vitamin D deficiency   . Umbilical hernia   . Hypertension   . Overweight    Past Medical History  Diagnosis Date  . Hypertension   . Obesity   . Vitamin D deficiency 2011  . Gout 2008    prior PCP diagnosed   No past surgical history on file. History  Substance Use Topics  . Smoking status: Former Smoker    Quit date: 05/25/2009  . Smokeless tobacco: Never Used  . Alcohol Use: No   Family History  Problem Relation Age of Onset  . Diabetes Father   . Diabetes Paternal Grandmother   . Diabetes Paternal Grandfather   . Diabetes Paternal Uncle   . CAD Father 5351    MI, stent x2  . CAD Paternal Grandfather   . Hypertension Neg Hx   . Stroke Neg Hx   . Cancer Paternal Grandfather     pancreatic   Allergies  Allergen Reactions  . Levaquin [Levofloxacin In D5w] Rash  . Penicillins    Current Outpatient Prescriptions on File Prior to Visit  Medication Sig Dispense Refill  . fluticasone (FLONASE) 50 MCG/ACT nasal spray Place 2 sprays into both nostrils daily.    Marland Kitchen. loratadine (CLARITIN) 10 MG tablet Take 10-20 mg by mouth daily as needed for allergies.    . phentermine (ADIPEX-P) 37.5 MG tablet  Take 1 tablet (37.5 mg total) by mouth daily before breakfast. 30 tablet 3  . traZODone (DESYREL) 100 MG tablet Take 1 tablet (100 mg total) by mouth at bedtime as needed for sleep. 30 tablet 6   No current facility-administered medications on file prior to visit.      Review of Systems Review of Systems  Constitutional: Negative for  appetite change, and unexpected weight change.  ENT pos for cong and rhinorrhea and sinus pain  Eyes: Negative for pain and visual disturbance.  Respiratory: Negative for wheeze and shortness of breath.   Cardiovascular: Negative for cp or palpitations    Gastrointestinal: Negative for nausea, diarrhea and constipation.  Genitourinary: Negative for urgency and frequency.  Skin: Negative for pallor or rash   Neurological: Negative for weakness, light-headedness, numbness and headaches.  Hematological: Negative for adenopathy. Does not bruise/bleed easily.  Psychiatric/Behavioral: Negative for dysphoric mood. The patient is not nervous/anxious.         Objective:   Physical Exam  Constitutional: He appears well-developed and well-nourished. No distress.  HENT:  Head: Normocephalic and atraumatic.  Right Ear: External ear normal.  Left Ear: External ear normal.  Mouth/Throat: Oropharynx is  clear and moist. No oropharyngeal exudate.  Nares are injected and congested   L ethmoid sinus tenderness Throat clear    Eyes: Conjunctivae and EOM are normal. Pupils are equal, round, and reactive to light. Right eye exhibits no discharge. Left eye exhibits no discharge.  Neck: Normal range of motion. Neck supple.  Cardiovascular: Normal rate and regular rhythm.   Pulmonary/Chest: Effort normal and breath sounds normal. No respiratory distress. He has no wheezes. He has no rales.  Lymphadenopathy:    He has no cervical adenopathy.  Neurological: He is alert. No cranial nerve deficit.  Skin: Skin is warm and dry. No rash noted.  Psychiatric: He has a normal  mood and affect.          Assessment & Plan:   Problem List Items Addressed This Visit      Respiratory   Acute sinusitis    L ethmoidal today Cover with cefdinir  Disc symptomatic care - see instructions on AVS  Update if not starting to improve in a week or if worsening        Relevant Medications   cefdinir (OMNICEF) capsule 300 mg    Other Visit Diagnoses    Acute ethmoidal sinusitis, recurrence not specified    -  Primary    Relevant Medications    cefdinir (OMNICEF) capsule 300 mg

## 2014-08-17 NOTE — Assessment & Plan Note (Signed)
L ethmoidal today Cover with cefdinir  Disc symptomatic care - see instructions on AVS  Update if not starting to improve in a week or if worsening

## 2014-08-17 NOTE — Progress Notes (Signed)
Pre visit review using our clinic review tool, if applicable. No additional management support is needed unless otherwise documented below in the visit note. 

## 2014-08-17 NOTE — Patient Instructions (Signed)
Take cefdinir for sinus infection  Drink lots of fluids and rest mucinex DM is helpful for congestion and cough  Try nasal saline spray  Also breathe steam   Update if not starting to improve in a week or if worsening

## 2014-11-02 ENCOUNTER — Other Ambulatory Visit: Payer: Self-pay | Admitting: Family Medicine

## 2014-11-02 MED ORDER — PHENTERMINE HCL 37.5 MG PO TABS: ORAL_TABLET | ORAL | Status: DC

## 2014-11-02 NOTE — Telephone Encounter (Signed)
plz phone in. Due for OV

## 2014-11-02 NOTE — Telephone Encounter (Signed)
Rx called in as directed.   

## 2014-11-02 NOTE — Telephone Encounter (Signed)
Ok to refill 

## 2015-03-04 ENCOUNTER — Ambulatory Visit (INDEPENDENT_AMBULATORY_CARE_PROVIDER_SITE_OTHER): Payer: PRIVATE HEALTH INSURANCE | Admitting: Family Medicine

## 2015-03-04 ENCOUNTER — Encounter: Payer: Self-pay | Admitting: Family Medicine

## 2015-03-04 VITALS — BP 148/72 | HR 85 | Temp 98.2°F | Ht 70.0 in | Wt 199.2 lb

## 2015-03-04 DIAGNOSIS — M7521 Bicipital tendinitis, right shoulder: Secondary | ICD-10-CM

## 2015-03-04 DIAGNOSIS — M7520 Bicipital tendinitis, unspecified shoulder: Secondary | ICD-10-CM | POA: Insufficient documentation

## 2015-03-04 MED ORDER — MELOXICAM 15 MG PO TABS: 15.0000 mg | ORAL_TABLET | Freq: Every day | ORAL | Status: DC

## 2015-03-04 NOTE — Assessment & Plan Note (Signed)
Sounds like pt had fatigue and then injury almost 6 wk ago  Px meloxicam 15 inst not to heavy lift/push/pull for now  Continue passive rom and general activity if not too painful Ice 10 min as often as possible  sched an appt with Dr Patsy Lager

## 2015-03-04 NOTE — Patient Instructions (Signed)
Use ice on your arm 10 minutes at a time as often as you can  Limit heavy lifting/ pushing and pulling with that arm  Take meloxicam 15 mg daily with food (instead of any otc pain medications)   Stop at check out to make an appt with Dr Patsy Lager next week

## 2015-03-04 NOTE — Progress Notes (Signed)
Subjective:    Patient ID: Roger Powell, male    DOB: 07-06-1971, 44 y.o.   MRN: [id number]  HPI Here for shoulder problem right -thought it was a tendon injury  Injured shoulder - June 16th   Was doing "dips" and went all the way down   Hurts in deltoid area  Pushing and pulling hurts  Feels a bit weak- hard to tell- also nervous to put full effort into it   Today - bicep has been in spasm - on and off through the day- it is twitching  Is fatigued as well   No bruising or swelling that he could tear   Can abduct arm/shoulder fully    Has had shoulder surgery on the L in the past - worked well   Patient Active Problem List   Diagnosis Date Noted  . Acute sinusitis 08/17/2014  . Tachycardia 06/03/2014  . Allergic rhinitis due to pollen 04/05/2014  . Poor eating habits 03/18/2014  . Bilateral leg pain 02/04/2014  . ? Restless leg syndrome 02/04/2014  . Gout   . Vitamin D deficiency   . Umbilical hernia   . Hypertension   . Overweight    Past Medical History  Diagnosis Date  . Hypertension   . Obesity   . Vitamin D deficiency 2011  . Gout 2008    prior PCP diagnosed   No past surgical history on file. History  Substance Use Topics  . Smoking status: Former Smoker    Quit date: 05/25/2009  . Smokeless tobacco: Never Used  . Alcohol Use: No   Family History  Problem Relation Age of Onset  . Diabetes Father   . Diabetes Paternal Grandmother   . Diabetes Paternal Grandfather   . Diabetes Paternal Uncle   . CAD Father 59    MI, stent x2  . CAD Paternal Grandfather   . Hypertension Neg Hx   . Stroke Neg Hx   . Cancer Paternal Grandfather     pancreatic   Allergies  Allergen Reactions  . Levaquin [Levofloxacin In D5w] Rash  . Penicillins    Current Outpatient Prescriptions on File Prior to Visit  Medication Sig Dispense Refill  . traZODone (DESYREL) 100 MG tablet Take 1 tablet (100 mg total) by mouth at bedtime as needed for sleep. 30 tablet 6  .  phentermine (ADIPEX-P) 37.5 MG tablet TAKE 1 TABLET BY MOUTH EVERY DAY BEFORE BREAKFAST (Patient not taking: Reported on 03/04/2015) 30 tablet 1   No current facility-administered medications on file prior to visit.      Review of Systems Review of Systems  Constitutional: Negative for fever, appetite change, fatigue and unexpected weight change.  Eyes: Negative for pain and visual disturbance.  Respiratory: Negative for cough and shortness of breath.   Cardiovascular: Negative for cp or palpitations    Gastrointestinal: Negative for nausea, diarrhea and constipation.  Genitourinary: Negative for urgency and frequency.  Skin: Negative for pallor or rash   MSK pos for R arm/shoulder pain , neg for joint swelling  Neurological: Negative for weakness, light-headedness, numbness and headaches.  Hematological: Negative for adenopathy. Does not bruise/bleed easily.  Psychiatric/Behavioral: Negative for dysphoric mood. The patient is not nervous/anxious.         Objective:   Physical Exam  Constitutional: He appears well-developed and well-nourished. No distress.  Well appearing muscular male   Neck: Normal range of motion. Neck supple.  Cardiovascular: Normal rate and regular rhythm.   Musculoskeletal: He exhibits  tenderness. He exhibits no edema.       Right shoulder: He exhibits tenderness. He exhibits normal range of motion, no bony tenderness, no swelling, no effusion, no crepitus, no deformity, normal pulse and normal strength.  Tender over biceps tendon in lateral shoulder area  No swelling or crepitus  No deformity  Pain to flex elbow and pronate arm  Nl rom shoulder  Neg Hawking/Neer tests  Nl int/ext rot of shoulder   No ecchymosis or erythema   Nl perf and sens   Lymphadenopathy:    He has no cervical adenopathy.  Neurological: He is alert.  Skin: Skin is warm and dry. No rash noted. No erythema. No pallor.  Psychiatric: He has a normal mood and affect.            Assessment & Plan:   Problem List Items Addressed This Visit    Biceps tendonitis - Primary    Sounds like pt had fatigue and then injury almost 6 wk ago  Px meloxicam 15 inst not to heavy lift/push/pull for now  Continue passive rom and general activity if not too painful Ice 10 min as often as possible  sched an appt with Dr Patsy Lager

## 2015-03-04 NOTE — Progress Notes (Signed)
Pre visit review using our clinic review tool, if applicable. No additional management support is needed unless otherwise documented below in the visit note. 

## 2015-03-07 ENCOUNTER — Other Ambulatory Visit: Payer: Self-pay

## 2015-03-07 NOTE — Telephone Encounter (Signed)
Pt left v/m requesting refill phentermine to [facility]. Pt was seen 03/04/15 for separate issue. Pt last saw Dr Reece Agar 06/03/14 and pt was to return in 3 months for f/u.Please advise.

## 2015-03-08 MED ORDER — PHENTERMINE HCL 37.5 MG PO TABS: ORAL_TABLET | ORAL | Status: AC

## 2015-03-08 NOTE — Telephone Encounter (Signed)
plz phone in. 

## 2015-03-08 NOTE — Telephone Encounter (Signed)
Rx called in as directed.   

## 2015-03-09 NOTE — Telephone Encounter (Signed)
Pt left v/m requesting refill status of phentermine; left detailed v/m per DPR that phentermine rx called into [facility] on 03/08/15.

## 2015-03-11 ENCOUNTER — Ambulatory Visit: Payer: PRIVATE HEALTH INSURANCE | Admitting: Family Medicine

## 2015-03-13 ENCOUNTER — Other Ambulatory Visit: Payer: Self-pay | Admitting: Family Medicine

## 2015-03-14 NOTE — Telephone Encounter (Addendum)
Ok to refill in Dr. Timoteo Expose absence? Last filled 06/03/14. Last OV 03/04/15.

## 2015-03-15 NOTE — Telephone Encounter (Signed)
Please refill times one in PCP absence 

## 2015-03-16 NOTE — Telephone Encounter (Signed)
Refilled as directed.  

## 2015-04-18 ENCOUNTER — Other Ambulatory Visit: Payer: Self-pay | Admitting: Family Medicine

## 2015-04-19 NOTE — Telephone Encounter (Signed)
Rout to PCP 

## 2016-02-06 IMAGING — CR DG LUMBAR SPINE COMPLETE 4+V
5 series · 5 of 5 positions shown · non-contrast
Comparison: None.

CLINICAL DATA: Low back and bilateral leg pain for 3 days, no
trauma

EXAM:
LUMBAR SPINE - COMPLETE 4+ VIEW

[view not recorded (1 of 5)]
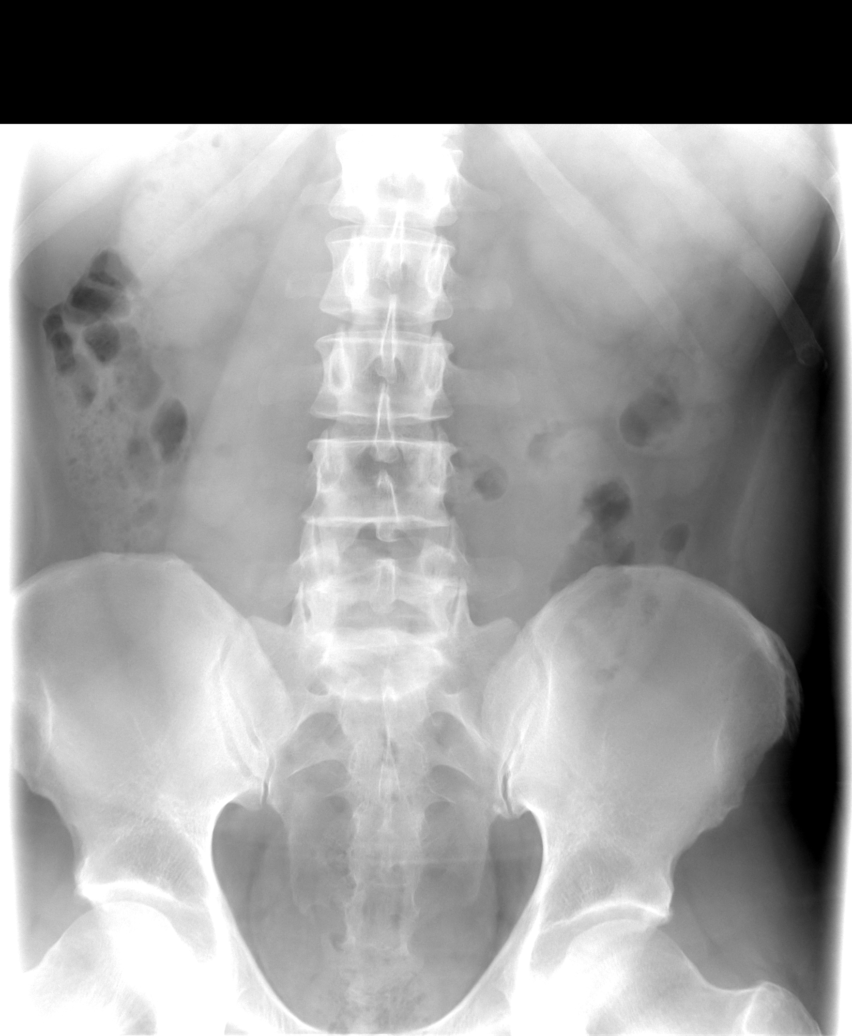

[view not recorded (2 of 5)]
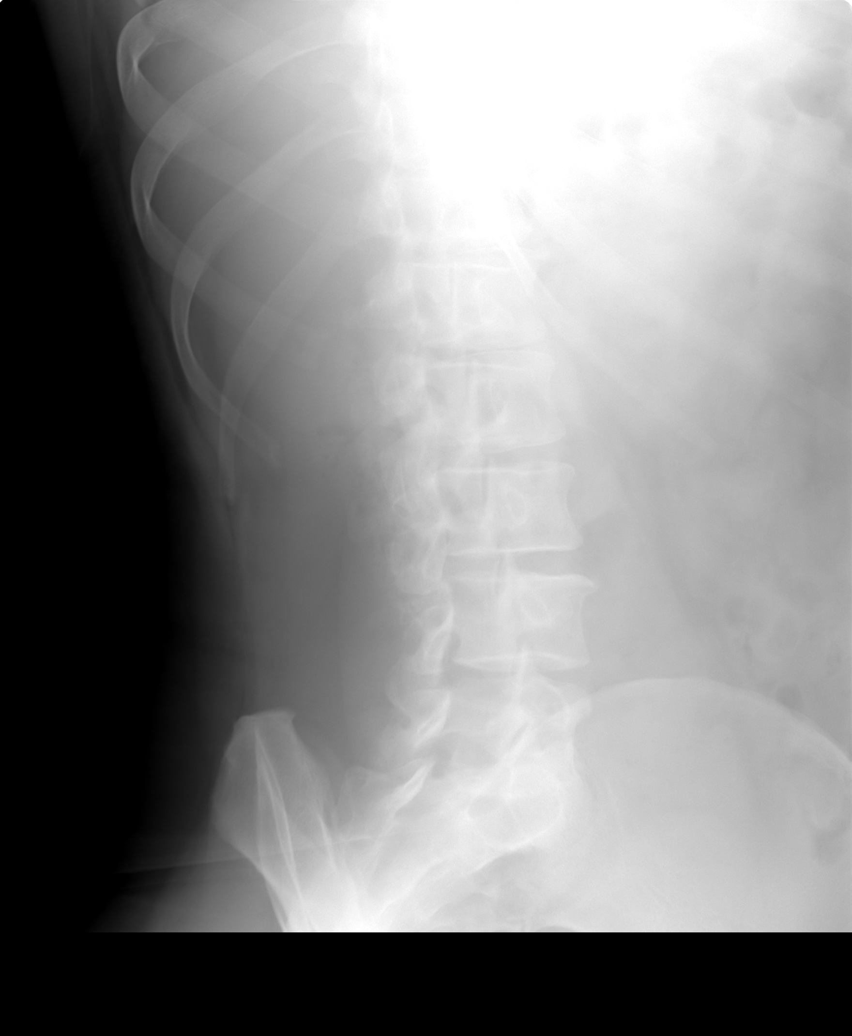

[view not recorded (3 of 5)]
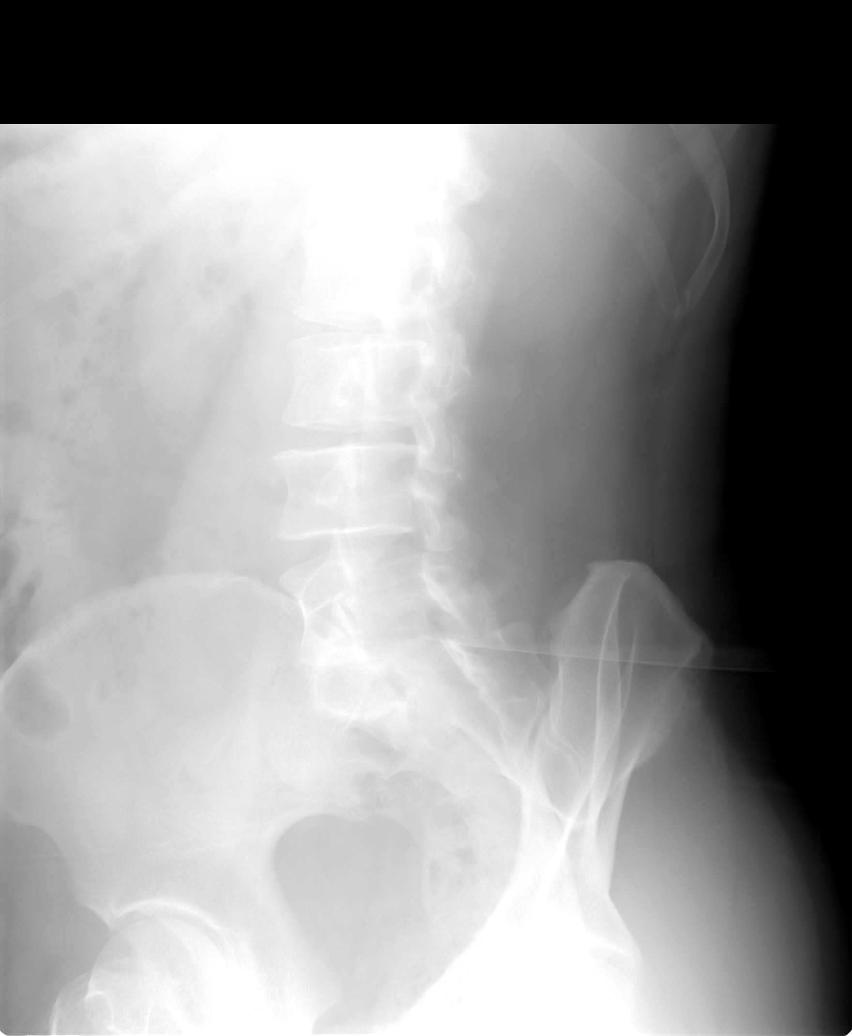

[view not recorded (4 of 5)]
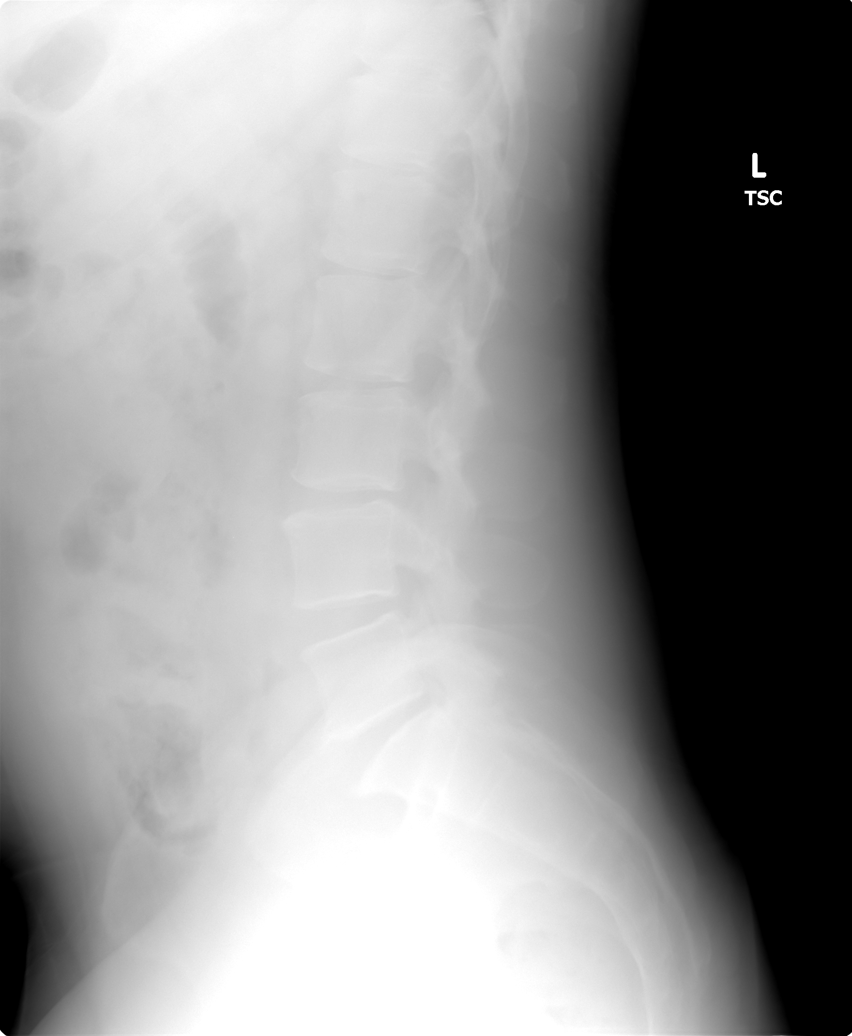

[view not recorded (5 of 5)]
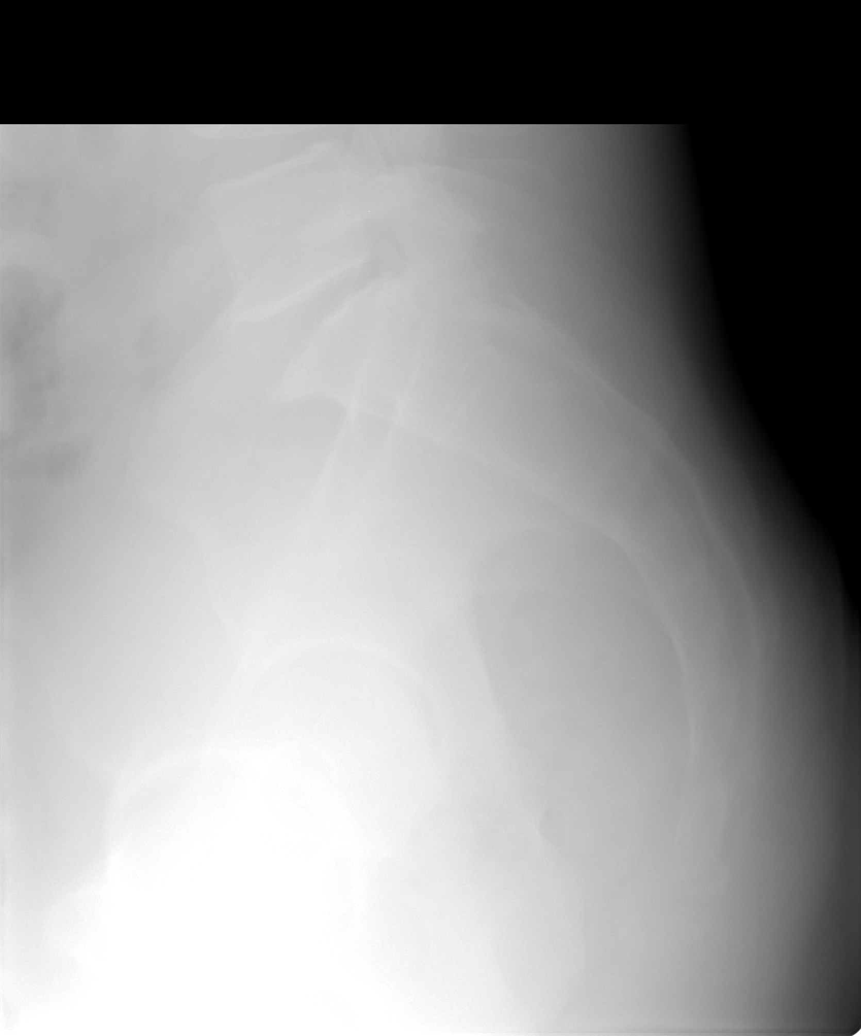

[5 of 5 positions shown; findings below may reference images not displayed]

FINDINGS: The lumbar vertebrae are in normal alignment. There is mild
degenerative disc disease at L5-S1 where there is slight loss of
disc space and spurring. The remainder of intervertebral disc spaces
appear normal. On oblique views no significant change involving the
facet joints is seen. The SI joints are corticated. The bowel gas
pattern is nonspecific.
IMPRESSION: Mild degenerative disc disease at L5-S1.  Normal alignment.
